# Patient Record
Sex: Female | Born: 1965
Health system: Southern US, Community
[De-identification: ages and names within clinical notes are randomized; demographics above are authoritative.]

---

## 2011-11-25 ENCOUNTER — Other Ambulatory Visit (HOSPITAL_COMMUNITY)
Admission: RE | Admit: 2011-11-25 | Discharge: 2011-11-25 | Disposition: A | Discharge status: Home / Self Care | Payer: BC Managed Care – PPO | Source: Ambulatory Visit | Attending: Family Medicine | Admitting: Family Medicine

## 2011-11-25 DIAGNOSIS — Z1151 Encounter for screening for human papillomavirus (HPV): Secondary | ICD-10-CM | POA: Insufficient documentation

## 2011-11-25 DIAGNOSIS — Z124 Encounter for screening for malignant neoplasm of cervix: Secondary | ICD-10-CM | POA: Insufficient documentation

## 2011-11-29 ENCOUNTER — Other Ambulatory Visit: Payer: Self-pay | Admitting: Family Medicine

## 2011-11-29 DIAGNOSIS — Z1231 Encounter for screening mammogram for malignant neoplasm of breast: Secondary | ICD-10-CM

## 2011-12-27 ENCOUNTER — Ambulatory Visit
Admission: RE | Admit: 2011-12-27 | Discharge: 2011-12-27 | Disposition: A | Discharge status: Home / Self Care | Payer: BC Managed Care – PPO | Source: Ambulatory Visit | Attending: Family Medicine | Admitting: Family Medicine

## 2011-12-27 DIAGNOSIS — Z1231 Encounter for screening mammogram for malignant neoplasm of breast: Secondary | ICD-10-CM

## 2013-02-15 ENCOUNTER — Other Ambulatory Visit: Payer: Self-pay

## 2013-02-15 DIAGNOSIS — Z1231 Encounter for screening mammogram for malignant neoplasm of breast: Secondary | ICD-10-CM

## 2013-03-27 ENCOUNTER — Ambulatory Visit
Admission: RE | Admit: 2013-03-27 | Discharge: 2013-03-27 | Disposition: A | Discharge status: Home / Self Care | Payer: BC Managed Care – PPO | Source: Ambulatory Visit

## 2013-03-27 DIAGNOSIS — Z1231 Encounter for screening mammogram for malignant neoplasm of breast: Secondary | ICD-10-CM

## 2013-03-28 ENCOUNTER — Other Ambulatory Visit: Payer: Self-pay | Admitting: Family Medicine

## 2013-03-28 DIAGNOSIS — R928 Other abnormal and inconclusive findings on diagnostic imaging of breast: Secondary | ICD-10-CM

## 2013-04-12 ENCOUNTER — Other Ambulatory Visit: Payer: BC Managed Care – PPO

## 2013-04-12 ENCOUNTER — Ambulatory Visit
Admission: RE | Admit: 2013-04-12 | Discharge: 2013-04-12 | Disposition: A | Discharge status: Home / Self Care | Payer: BC Managed Care – PPO | Source: Ambulatory Visit | Attending: Family Medicine | Admitting: Family Medicine

## 2013-04-12 DIAGNOSIS — R928 Other abnormal and inconclusive findings on diagnostic imaging of breast: Secondary | ICD-10-CM

## 2014-09-04 ENCOUNTER — Other Ambulatory Visit: Payer: Self-pay

## 2014-09-04 DIAGNOSIS — Z1231 Encounter for screening mammogram for malignant neoplasm of breast: Secondary | ICD-10-CM

## 2014-10-03 ENCOUNTER — Other Ambulatory Visit: Payer: Self-pay | Admitting: Family Medicine

## 2014-10-03 ENCOUNTER — Ambulatory Visit
Admission: RE | Admit: 2014-10-03 | Discharge: 2014-10-03 | Disposition: A | Discharge status: Home / Self Care | Payer: BLUE CROSS/BLUE SHIELD | Source: Ambulatory Visit

## 2014-10-03 DIAGNOSIS — R928 Other abnormal and inconclusive findings on diagnostic imaging of breast: Secondary | ICD-10-CM

## 2014-10-03 DIAGNOSIS — Z1231 Encounter for screening mammogram for malignant neoplasm of breast: Secondary | ICD-10-CM

## 2014-10-14 ENCOUNTER — Ambulatory Visit
Admission: RE | Admit: 2014-10-14 | Discharge: 2014-10-14 | Disposition: A | Discharge status: Home / Self Care | Payer: BLUE CROSS/BLUE SHIELD | Source: Ambulatory Visit | Attending: Family Medicine | Admitting: Family Medicine

## 2014-10-14 DIAGNOSIS — R928 Other abnormal and inconclusive findings on diagnostic imaging of breast: Secondary | ICD-10-CM

## 2014-10-21 ENCOUNTER — Other Ambulatory Visit (HOSPITAL_COMMUNITY)
Admission: RE | Admit: 2014-10-21 | Discharge: 2014-10-21 | Disposition: A | Discharge status: Home / Self Care | Payer: BLUE CROSS/BLUE SHIELD | Source: Ambulatory Visit | Attending: Family Medicine | Admitting: Family Medicine

## 2014-10-21 ENCOUNTER — Other Ambulatory Visit: Payer: Self-pay | Admitting: Family Medicine

## 2014-10-21 DIAGNOSIS — Z124 Encounter for screening for malignant neoplasm of cervix: Secondary | ICD-10-CM | POA: Insufficient documentation

## 2014-10-23 LAB — CYTOLOGY - PAP

## 2015-03-10 ENCOUNTER — Other Ambulatory Visit: Payer: Self-pay | Admitting: Family Medicine

## 2015-03-10 DIAGNOSIS — R928 Other abnormal and inconclusive findings on diagnostic imaging of breast: Secondary | ICD-10-CM

## 2015-05-04 ENCOUNTER — Ambulatory Visit
Admission: RE | Admit: 2015-05-04 | Discharge: 2015-05-04 | Disposition: A | Discharge status: Home / Self Care | Payer: BLUE CROSS/BLUE SHIELD | Source: Ambulatory Visit | Attending: Family Medicine | Admitting: Family Medicine

## 2015-05-04 DIAGNOSIS — R928 Other abnormal and inconclusive findings on diagnostic imaging of breast: Secondary | ICD-10-CM

## 2015-07-01 DIAGNOSIS — J029 Acute pharyngitis, unspecified: Secondary | ICD-10-CM | POA: Diagnosis not present

## 2015-07-24 DIAGNOSIS — S51852A Open bite of left forearm, initial encounter: Secondary | ICD-10-CM | POA: Diagnosis not present

## 2015-08-05 DIAGNOSIS — K529 Noninfective gastroenteritis and colitis, unspecified: Secondary | ICD-10-CM | POA: Diagnosis not present

## 2015-08-05 DIAGNOSIS — S41152D Open bite of left upper arm, subsequent encounter: Secondary | ICD-10-CM | POA: Diagnosis not present

## 2015-08-13 DIAGNOSIS — R197 Diarrhea, unspecified: Secondary | ICD-10-CM | POA: Diagnosis not present

## 2015-08-14 DIAGNOSIS — R197 Diarrhea, unspecified: Secondary | ICD-10-CM | POA: Diagnosis not present

## 2015-08-18 DIAGNOSIS — R197 Diarrhea, unspecified: Secondary | ICD-10-CM | POA: Diagnosis not present

## 2015-08-18 DIAGNOSIS — S51852A Open bite of left forearm, initial encounter: Secondary | ICD-10-CM | POA: Diagnosis not present

## 2015-09-16 ENCOUNTER — Other Ambulatory Visit: Payer: Self-pay | Admitting: Family Medicine

## 2015-09-16 DIAGNOSIS — Z1231 Encounter for screening mammogram for malignant neoplasm of breast: Secondary | ICD-10-CM

## 2015-10-14 ENCOUNTER — Ambulatory Visit: Payer: BLUE CROSS/BLUE SHIELD

## 2015-10-14 DIAGNOSIS — Z13220 Encounter for screening for lipoid disorders: Secondary | ICD-10-CM | POA: Diagnosis not present

## 2015-10-14 DIAGNOSIS — Z0000 Encounter for general adult medical examination without abnormal findings: Secondary | ICD-10-CM | POA: Diagnosis not present

## 2015-10-14 DIAGNOSIS — Z131 Encounter for screening for diabetes mellitus: Secondary | ICD-10-CM | POA: Diagnosis not present

## 2015-10-14 DIAGNOSIS — N951 Menopausal and female climacteric states: Secondary | ICD-10-CM | POA: Diagnosis not present

## 2015-10-16 ENCOUNTER — Ambulatory Visit
Admission: RE | Admit: 2015-10-16 | Discharge: 2015-10-16 | Disposition: A | Discharge status: Home / Self Care | Payer: BLUE CROSS/BLUE SHIELD | Source: Ambulatory Visit | Attending: Family Medicine | Admitting: Family Medicine

## 2015-10-16 DIAGNOSIS — Z1231 Encounter for screening mammogram for malignant neoplasm of breast: Secondary | ICD-10-CM | POA: Diagnosis not present

## 2016-01-13 DIAGNOSIS — Z23 Encounter for immunization: Secondary | ICD-10-CM | POA: Diagnosis not present

## 2016-01-13 DIAGNOSIS — N951 Menopausal and female climacteric states: Secondary | ICD-10-CM | POA: Diagnosis not present

## 2016-01-13 DIAGNOSIS — G479 Sleep disorder, unspecified: Secondary | ICD-10-CM | POA: Diagnosis not present

## 2016-05-25 DIAGNOSIS — L578 Other skin changes due to chronic exposure to nonionizing radiation: Secondary | ICD-10-CM | POA: Diagnosis not present

## 2016-05-25 DIAGNOSIS — L821 Other seborrheic keratosis: Secondary | ICD-10-CM | POA: Diagnosis not present

## 2016-05-25 DIAGNOSIS — L720 Epidermal cyst: Secondary | ICD-10-CM | POA: Diagnosis not present

## 2016-05-25 DIAGNOSIS — L814 Other melanin hyperpigmentation: Secondary | ICD-10-CM | POA: Diagnosis not present

## 2016-09-07 ENCOUNTER — Other Ambulatory Visit: Payer: Self-pay | Admitting: Family Medicine

## 2016-09-07 DIAGNOSIS — Z1231 Encounter for screening mammogram for malignant neoplasm of breast: Secondary | ICD-10-CM

## 2016-10-17 ENCOUNTER — Ambulatory Visit: Payer: BLUE CROSS/BLUE SHIELD

## 2016-10-17 ENCOUNTER — Ambulatory Visit
Admission: RE | Admit: 2016-10-17 | Discharge: 2016-10-17 | Disposition: A | Discharge status: Home / Self Care | Payer: BLUE CROSS/BLUE SHIELD | Source: Ambulatory Visit | Attending: Family Medicine | Admitting: Family Medicine

## 2016-10-17 DIAGNOSIS — Z1231 Encounter for screening mammogram for malignant neoplasm of breast: Secondary | ICD-10-CM | POA: Diagnosis not present

## 2016-10-20 DIAGNOSIS — Z13220 Encounter for screening for lipoid disorders: Secondary | ICD-10-CM | POA: Diagnosis not present

## 2016-10-20 DIAGNOSIS — Z0000 Encounter for general adult medical examination without abnormal findings: Secondary | ICD-10-CM | POA: Diagnosis not present

## 2016-10-20 DIAGNOSIS — N951 Menopausal and female climacteric states: Secondary | ICD-10-CM | POA: Diagnosis not present

## 2016-10-20 DIAGNOSIS — Z131 Encounter for screening for diabetes mellitus: Secondary | ICD-10-CM | POA: Diagnosis not present

## 2017-06-09 DIAGNOSIS — L720 Epidermal cyst: Secondary | ICD-10-CM | POA: Diagnosis not present

## 2017-06-23 DIAGNOSIS — L720 Epidermal cyst: Secondary | ICD-10-CM | POA: Diagnosis not present

## 2017-08-22 ENCOUNTER — Other Ambulatory Visit: Payer: Self-pay | Admitting: Family Medicine

## 2017-08-22 DIAGNOSIS — Z1231 Encounter for screening mammogram for malignant neoplasm of breast: Secondary | ICD-10-CM

## 2017-09-05 DIAGNOSIS — D229 Melanocytic nevi, unspecified: Secondary | ICD-10-CM | POA: Diagnosis not present

## 2017-09-05 DIAGNOSIS — L720 Epidermal cyst: Secondary | ICD-10-CM | POA: Diagnosis not present

## 2017-10-18 ENCOUNTER — Ambulatory Visit
Admission: RE | Admit: 2017-10-18 | Discharge: 2017-10-18 | Disposition: A | Discharge status: Home / Self Care | Payer: BLUE CROSS/BLUE SHIELD | Source: Ambulatory Visit | Attending: Family Medicine | Admitting: Family Medicine

## 2017-10-18 DIAGNOSIS — Z1231 Encounter for screening mammogram for malignant neoplasm of breast: Secondary | ICD-10-CM | POA: Diagnosis not present

## 2017-10-19 ENCOUNTER — Other Ambulatory Visit: Payer: Self-pay | Admitting: Family Medicine

## 2017-10-19 DIAGNOSIS — R928 Other abnormal and inconclusive findings on diagnostic imaging of breast: Secondary | ICD-10-CM

## 2017-10-24 ENCOUNTER — Ambulatory Visit
Admission: RE | Admit: 2017-10-24 | Discharge: 2017-10-24 | Disposition: A | Discharge status: Home / Self Care | Payer: BLUE CROSS/BLUE SHIELD | Source: Ambulatory Visit | Attending: Family Medicine | Admitting: Family Medicine

## 2017-10-24 DIAGNOSIS — R922 Inconclusive mammogram: Secondary | ICD-10-CM | POA: Diagnosis not present

## 2017-10-24 DIAGNOSIS — R928 Other abnormal and inconclusive findings on diagnostic imaging of breast: Secondary | ICD-10-CM

## 2017-10-24 DIAGNOSIS — N6310 Unspecified lump in the right breast, unspecified quadrant: Secondary | ICD-10-CM | POA: Diagnosis not present

## 2018-04-24 DIAGNOSIS — J40 Bronchitis, not specified as acute or chronic: Secondary | ICD-10-CM | POA: Diagnosis not present

## 2018-04-24 DIAGNOSIS — R05 Cough: Secondary | ICD-10-CM | POA: Diagnosis not present

## 2018-05-22 ENCOUNTER — Other Ambulatory Visit: Payer: Self-pay | Admitting: Family Medicine

## 2018-05-22 ENCOUNTER — Other Ambulatory Visit (HOSPITAL_COMMUNITY)
Admission: RE | Admit: 2018-05-22 | Discharge: 2018-05-22 | Disposition: A | Discharge status: Home / Self Care | Payer: BLUE CROSS/BLUE SHIELD | Source: Ambulatory Visit | Attending: Family Medicine | Admitting: Family Medicine

## 2018-05-22 DIAGNOSIS — Z124 Encounter for screening for malignant neoplasm of cervix: Secondary | ICD-10-CM | POA: Insufficient documentation

## 2018-05-22 DIAGNOSIS — N951 Menopausal and female climacteric states: Secondary | ICD-10-CM | POA: Diagnosis not present

## 2018-05-22 DIAGNOSIS — Z0000 Encounter for general adult medical examination without abnormal findings: Secondary | ICD-10-CM | POA: Diagnosis not present

## 2018-05-22 DIAGNOSIS — N950 Postmenopausal bleeding: Secondary | ICD-10-CM | POA: Diagnosis not present

## 2018-05-24 ENCOUNTER — Other Ambulatory Visit: Payer: Self-pay | Admitting: Family Medicine

## 2018-05-24 DIAGNOSIS — N950 Postmenopausal bleeding: Secondary | ICD-10-CM

## 2018-05-24 LAB — CYTOLOGY - PAP
Diagnosis: NEGATIVE
HPV: NOT DETECTED

## 2018-06-18 ENCOUNTER — Other Ambulatory Visit: Payer: BLUE CROSS/BLUE SHIELD

## 2018-09-03 ENCOUNTER — Ambulatory Visit
Admission: RE | Admit: 2018-09-03 | Discharge: 2018-09-03 | Disposition: A | Discharge status: Home / Self Care | Payer: BLUE CROSS/BLUE SHIELD | Source: Ambulatory Visit | Attending: Family Medicine | Admitting: Family Medicine

## 2018-09-03 DIAGNOSIS — N950 Postmenopausal bleeding: Secondary | ICD-10-CM

## 2018-09-12 DIAGNOSIS — N950 Postmenopausal bleeding: Secondary | ICD-10-CM | POA: Diagnosis not present

## 2018-09-13 DIAGNOSIS — R300 Dysuria: Secondary | ICD-10-CM | POA: Diagnosis not present

## 2018-09-26 ENCOUNTER — Other Ambulatory Visit: Payer: Self-pay | Admitting: Family Medicine

## 2018-09-26 ENCOUNTER — Other Ambulatory Visit: Payer: Self-pay | Admitting: Obstetrics and Gynecology

## 2018-09-26 DIAGNOSIS — N841 Polyp of cervix uteri: Secondary | ICD-10-CM | POA: Diagnosis not present

## 2018-09-26 DIAGNOSIS — Z1231 Encounter for screening mammogram for malignant neoplasm of breast: Secondary | ICD-10-CM

## 2018-09-26 DIAGNOSIS — N959 Unspecified menopausal and perimenopausal disorder: Secondary | ICD-10-CM | POA: Diagnosis not present

## 2018-09-26 DIAGNOSIS — Z803 Family history of malignant neoplasm of breast: Secondary | ICD-10-CM | POA: Diagnosis not present

## 2018-09-26 DIAGNOSIS — N858 Other specified noninflammatory disorders of uterus: Secondary | ICD-10-CM | POA: Diagnosis not present

## 2018-09-26 DIAGNOSIS — N950 Postmenopausal bleeding: Secondary | ICD-10-CM | POA: Diagnosis not present

## 2018-09-26 DIAGNOSIS — N871 Moderate cervical dysplasia: Secondary | ICD-10-CM | POA: Diagnosis not present

## 2018-11-15 ENCOUNTER — Ambulatory Visit: Payer: BC Managed Care – PPO

## 2018-12-25 ENCOUNTER — Other Ambulatory Visit: Payer: Self-pay

## 2018-12-25 ENCOUNTER — Ambulatory Visit
Admission: RE | Admit: 2018-12-25 | Discharge: 2018-12-25 | Disposition: A | Discharge status: Home / Self Care | Payer: BC Managed Care – PPO | Source: Ambulatory Visit | Attending: Family Medicine | Admitting: Family Medicine

## 2018-12-25 DIAGNOSIS — Z1231 Encounter for screening mammogram for malignant neoplasm of breast: Secondary | ICD-10-CM | POA: Diagnosis not present

## 2019-08-19 DIAGNOSIS — Z13220 Encounter for screening for lipoid disorders: Secondary | ICD-10-CM | POA: Diagnosis not present

## 2019-08-19 DIAGNOSIS — Z0000 Encounter for general adult medical examination without abnormal findings: Secondary | ICD-10-CM | POA: Diagnosis not present

## 2019-08-19 DIAGNOSIS — Z131 Encounter for screening for diabetes mellitus: Secondary | ICD-10-CM | POA: Diagnosis not present

## 2019-08-27 ENCOUNTER — Other Ambulatory Visit: Payer: Self-pay | Admitting: Family Medicine

## 2019-08-27 DIAGNOSIS — Z1231 Encounter for screening mammogram for malignant neoplasm of breast: Secondary | ICD-10-CM

## 2020-01-03 DIAGNOSIS — D229 Melanocytic nevi, unspecified: Secondary | ICD-10-CM | POA: Diagnosis not present

## 2020-01-03 DIAGNOSIS — L720 Epidermal cyst: Secondary | ICD-10-CM | POA: Diagnosis not present

## 2020-01-03 DIAGNOSIS — L814 Other melanin hyperpigmentation: Secondary | ICD-10-CM | POA: Diagnosis not present

## 2020-01-09 DIAGNOSIS — Z23 Encounter for immunization: Secondary | ICD-10-CM | POA: Diagnosis not present

## 2020-01-27 ENCOUNTER — Ambulatory Visit: Payer: Self-pay | Attending: Internal Medicine

## 2020-01-27 ENCOUNTER — Other Ambulatory Visit (HOSPITAL_BASED_OUTPATIENT_CLINIC_OR_DEPARTMENT_OTHER): Payer: Self-pay | Admitting: Internal Medicine

## 2020-01-27 DIAGNOSIS — Z23 Encounter for immunization: Secondary | ICD-10-CM

## 2020-01-28 MED FILL — PFIZER-BIONTECH COVID-19 VA: 30 | 1 days supply | Qty: 0 | Fill #0

## 2020-01-30 DIAGNOSIS — L720 Epidermal cyst: Secondary | ICD-10-CM | POA: Diagnosis not present

## 2020-02-17 DIAGNOSIS — L720 Epidermal cyst: Secondary | ICD-10-CM | POA: Diagnosis not present

## 2020-03-02 ENCOUNTER — Ambulatory Visit: Payer: BC Managed Care – PPO

## 2020-03-12 DIAGNOSIS — L089 Local infection of the skin and subcutaneous tissue, unspecified: Secondary | ICD-10-CM | POA: Diagnosis not present

## 2020-03-12 DIAGNOSIS — L810 Postinflammatory hyperpigmentation: Secondary | ICD-10-CM | POA: Diagnosis not present

## 2020-03-12 DIAGNOSIS — L723 Sebaceous cyst: Secondary | ICD-10-CM | POA: Diagnosis not present

## 2020-04-07 ENCOUNTER — Ambulatory Visit: Payer: Self-pay

## 2020-05-07 ENCOUNTER — Ambulatory Visit
Admission: RE | Admit: 2020-05-07 | Discharge: 2020-05-07 | Disposition: A | Discharge status: Home / Self Care | Payer: BC Managed Care – PPO | Source: Ambulatory Visit | Attending: Family Medicine | Admitting: Family Medicine

## 2020-05-07 ENCOUNTER — Other Ambulatory Visit: Payer: Self-pay

## 2020-05-07 DIAGNOSIS — Z1231 Encounter for screening mammogram for malignant neoplasm of breast: Secondary | ICD-10-CM | POA: Diagnosis not present

## 2020-08-21 DIAGNOSIS — Z0000 Encounter for general adult medical examination without abnormal findings: Secondary | ICD-10-CM | POA: Diagnosis not present

## 2020-08-21 DIAGNOSIS — Z23 Encounter for immunization: Secondary | ICD-10-CM | POA: Diagnosis not present

## 2020-09-07 DIAGNOSIS — Z1211 Encounter for screening for malignant neoplasm of colon: Secondary | ICD-10-CM | POA: Diagnosis not present

## 2020-09-10 DIAGNOSIS — R21 Rash and other nonspecific skin eruption: Secondary | ICD-10-CM | POA: Diagnosis not present

## 2020-09-18 DIAGNOSIS — R21 Rash and other nonspecific skin eruption: Secondary | ICD-10-CM | POA: Diagnosis not present

## 2020-10-21 DIAGNOSIS — Z23 Encounter for immunization: Secondary | ICD-10-CM | POA: Diagnosis not present

## 2020-12-31 DIAGNOSIS — Z23 Encounter for immunization: Secondary | ICD-10-CM | POA: Diagnosis not present

## 2021-06-18 ENCOUNTER — Other Ambulatory Visit: Payer: Self-pay | Admitting: Family Medicine

## 2021-06-18 DIAGNOSIS — Z1231 Encounter for screening mammogram for malignant neoplasm of breast: Secondary | ICD-10-CM

## 2021-06-21 ENCOUNTER — Ambulatory Visit
Admission: RE | Admit: 2021-06-21 | Discharge: 2021-06-21 | Disposition: A | Discharge status: Home / Self Care | Payer: BC Managed Care – PPO | Source: Ambulatory Visit | Attending: Family Medicine | Admitting: Family Medicine

## 2021-06-21 DIAGNOSIS — Z1231 Encounter for screening mammogram for malignant neoplasm of breast: Secondary | ICD-10-CM

## 2021-06-23 ENCOUNTER — Other Ambulatory Visit: Payer: Self-pay | Admitting: Family Medicine

## 2021-06-23 DIAGNOSIS — R928 Other abnormal and inconclusive findings on diagnostic imaging of breast: Secondary | ICD-10-CM

## 2021-07-06 DIAGNOSIS — R350 Frequency of micturition: Secondary | ICD-10-CM | POA: Diagnosis not present

## 2021-07-06 DIAGNOSIS — R109 Unspecified abdominal pain: Secondary | ICD-10-CM | POA: Diagnosis not present

## 2021-07-08 ENCOUNTER — Ambulatory Visit: Payer: BC Managed Care – PPO

## 2021-07-08 ENCOUNTER — Ambulatory Visit
Admission: RE | Admit: 2021-07-08 | Discharge: 2021-07-08 | Disposition: A | Discharge status: Home / Self Care | Payer: BC Managed Care – PPO | Source: Ambulatory Visit | Attending: Family Medicine | Admitting: Family Medicine

## 2021-07-08 DIAGNOSIS — R928 Other abnormal and inconclusive findings on diagnostic imaging of breast: Secondary | ICD-10-CM

## 2021-08-27 DIAGNOSIS — Z0000 Encounter for general adult medical examination without abnormal findings: Secondary | ICD-10-CM | POA: Diagnosis not present

## 2021-08-27 DIAGNOSIS — Z13220 Encounter for screening for lipoid disorders: Secondary | ICD-10-CM | POA: Diagnosis not present

## 2021-08-27 DIAGNOSIS — Z23 Encounter for immunization: Secondary | ICD-10-CM | POA: Diagnosis not present

## 2021-08-27 DIAGNOSIS — K089 Disorder of teeth and supporting structures, unspecified: Secondary | ICD-10-CM | POA: Diagnosis not present

## 2021-08-27 DIAGNOSIS — Z130 Encounter for screening for diseases of the blood and blood-forming organs and certain disorders involving the immune mechanism: Secondary | ICD-10-CM | POA: Diagnosis not present

## 2021-08-27 DIAGNOSIS — Z1321 Encounter for screening for nutritional disorder: Secondary | ICD-10-CM | POA: Diagnosis not present

## 2021-08-27 DIAGNOSIS — M5451 Vertebrogenic low back pain: Secondary | ICD-10-CM | POA: Diagnosis not present

## 2021-08-27 DIAGNOSIS — Z131 Encounter for screening for diabetes mellitus: Secondary | ICD-10-CM | POA: Diagnosis not present

## 2022-01-07 DIAGNOSIS — Z23 Encounter for immunization: Secondary | ICD-10-CM | POA: Diagnosis not present

## 2022-02-08 ENCOUNTER — Ambulatory Visit (INDEPENDENT_AMBULATORY_CARE_PROVIDER_SITE_OTHER): Payer: BC Managed Care – PPO | Admitting: Sports Medicine

## 2022-02-08 ENCOUNTER — Ambulatory Visit: Payer: Self-pay

## 2022-02-08 VITALS — BP 122/78 | Ht 66.0 in | Wt 138.0 lb

## 2022-02-08 DIAGNOSIS — M25561 Pain in right knee: Secondary | ICD-10-CM

## 2022-02-08 DIAGNOSIS — G8929 Other chronic pain: Secondary | ICD-10-CM

## 2022-02-08 DIAGNOSIS — M23203 Derangement of unspecified medial meniscus due to old tear or injury, right knee: Secondary | ICD-10-CM

## 2022-02-08 NOTE — Progress Notes (Addendum)
  SUBJECTIVE:   Chief Complaint: right knee pain  History of Presenting Illness:   Natasha Knight is a 56 year old female without a significant past medical history who presents with right knee pain.  She has been experiencing mild knee discomfort for over five years, worse with traversing stairs particularly descending. This past summer in 08-2021, she fell while on a trip to [place] and hit her right knee. Since then, the right knee has hurt significantly more than the left. The pain is located on the medial side of her right knee and occasionally she feels it radiate into her foot. It is worse with activity including prolonged periods of standing and certain movements, specifically squats, are very painful. Sometimes, the pain is accompanied by swelling. Oftentimes, the discomfort will awaken her during the night. Denies redness, clicking, locking, catching, and any numbness or tingling.  Despite the pain, she continues to remain physically active and attends an exercise class about three days per week. She works as a Museum/gallery exhibitions officer and spends most of the day on her feet. Exercise is an important source of stress relief for her and she has physical activities, including a ski trip, planned for this upcoming holiday season.    Pertinent Medical History:   none  No past medical history on file.    OBJECTIVE:   BP 122/78   Ht 5\' 6"  (1.676 m)   Wt 138 lb (62.6 kg)   LMP 08/13/2014   BMI 22.27 kg/m   Physical Examination:  Right Knee No erythema or swelling, mild tenderness to palpation over joint line, flexion 145 compared to 160 on left side, extension 0, sensation to light touch intact across dermatomes L4-L5, strength 5/5 with extension-flexion, no valgus-varus laxity, Lachman test negative, McMurray test negative, Thessaly test negative  Right Hip Strength 5/5 with abduction-flexion  Ultrasound of Right Knee Osteophyte on distal femur adjacent to articular surface,  split cartilage defect on medial side of joint, quadriceps and patellar tendons intact, medial and lateral menisci intact, medial and lateral cruciate ligaments intact    ASSESSMENT/PLAN:   Patient's presentation is most consistent with symptomatic osteophyte on the distal femur secondary to trauma from a fall sustained in 08-2021. Split cartilage defect possibly contributing to pain as well.  > Strengthening exercises targeting quadriceps while minimizing load on medial cartilage > Cryotherapy and compression sleeve to reduce inflammation  > Consider oral or topical nonsteroidal antiinflammatory drugs as needed > Follow-up with Natasha Knight in about four weeks > Exercise good judgement to avoid certain strenuous activities that elicit pain    There are no diagnoses linked to this encounter.  No follow-ups on file.  [facility], MD  02/08/2022, 9:57 AM  This patient evaluated and examined with Dr 02/10/2022  The ultrasound is as follows  Right Knee Ultrasound Suprapatellar pouch - small effusion on RT but none on left Medial meniscus with hypoechoic area of possible split and small marginal spurs at joint line Lateral meniscus is normal Quad and patellar tendons normal Trochear groove is normal  Impression; probable degenerative changes in medial meniscus RT knee exacerbated by a fall  Ultrasound and interpretation by Dr. Clearance Coots and  Clearance Coots. Fields, MD   I agree with other exam and history fidnigns.  Sibyl Parr, mD

## 2022-02-08 NOTE — Assessment & Plan Note (Signed)
Conservative care HEP Icing Compression sleeve  Reck in 2 to 3 mos

## 2022-03-04 DIAGNOSIS — J019 Acute sinusitis, unspecified: Secondary | ICD-10-CM | POA: Diagnosis not present

## 2022-03-21 DIAGNOSIS — B974 Respiratory syncytial virus as the cause of diseases classified elsewhere: Secondary | ICD-10-CM | POA: Diagnosis not present

## 2022-03-21 DIAGNOSIS — J069 Acute upper respiratory infection, unspecified: Secondary | ICD-10-CM | POA: Diagnosis not present

## 2022-06-13 DIAGNOSIS — J189 Pneumonia, unspecified organism: Secondary | ICD-10-CM | POA: Diagnosis not present

## 2022-06-16 DIAGNOSIS — R911 Solitary pulmonary nodule: Secondary | ICD-10-CM | POA: Diagnosis not present

## 2022-08-04 DIAGNOSIS — R21 Rash and other nonspecific skin eruption: Secondary | ICD-10-CM | POA: Diagnosis not present

## 2022-09-29 ENCOUNTER — Other Ambulatory Visit: Payer: Self-pay | Admitting: Family Medicine

## 2022-09-29 DIAGNOSIS — Z0000 Encounter for general adult medical examination without abnormal findings: Secondary | ICD-10-CM | POA: Diagnosis not present

## 2022-09-29 DIAGNOSIS — Z1231 Encounter for screening mammogram for malignant neoplasm of breast: Secondary | ICD-10-CM

## 2022-09-30 ENCOUNTER — Other Ambulatory Visit: Payer: Self-pay | Admitting: Family Medicine

## 2022-09-30 DIAGNOSIS — E2839 Other primary ovarian failure: Secondary | ICD-10-CM

## 2022-10-04 ENCOUNTER — Ambulatory Visit: Payer: BC Managed Care – PPO

## 2022-10-04 DIAGNOSIS — Z1231 Encounter for screening mammogram for malignant neoplasm of breast: Secondary | ICD-10-CM

## 2023-07-18 IMAGING — MG MM DIGITAL SCREENING BILAT W/ TOMO AND CAD
8 series · 9 of 24 positions shown · non-contrast
Comparison: Previous exam(s).

CLINICAL DATA: Screening.

EXAM:
DIGITAL SCREENING BILATERAL MAMMOGRAM WITH TOMOSYNTHESIS AND CAD
TECHNIQUE: Bilateral screening digital craniocaudal and mediolateral oblique
mammograms were obtained. Bilateral screening digital breast
tomosynthesis was performed. The images were evaluated with
computer-aided detection.

[R CC synth-2D]
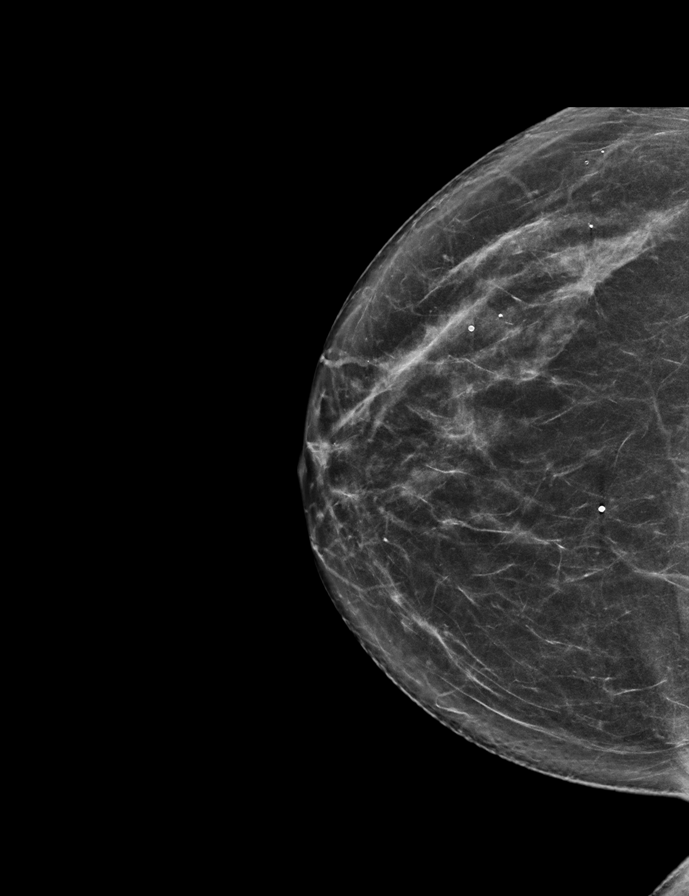

[R MLO synth-2D]
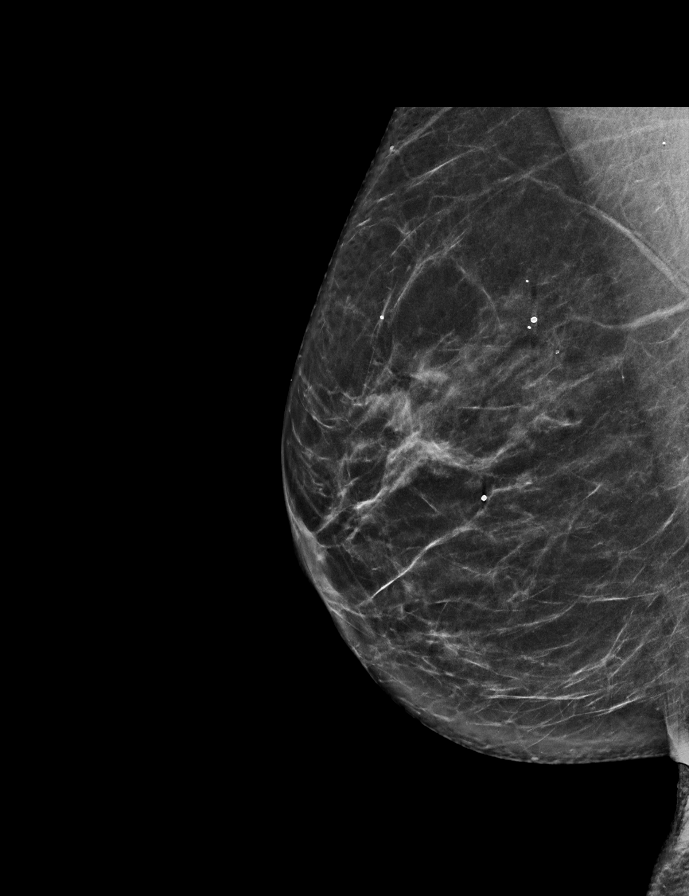

[L CC synth-2D]
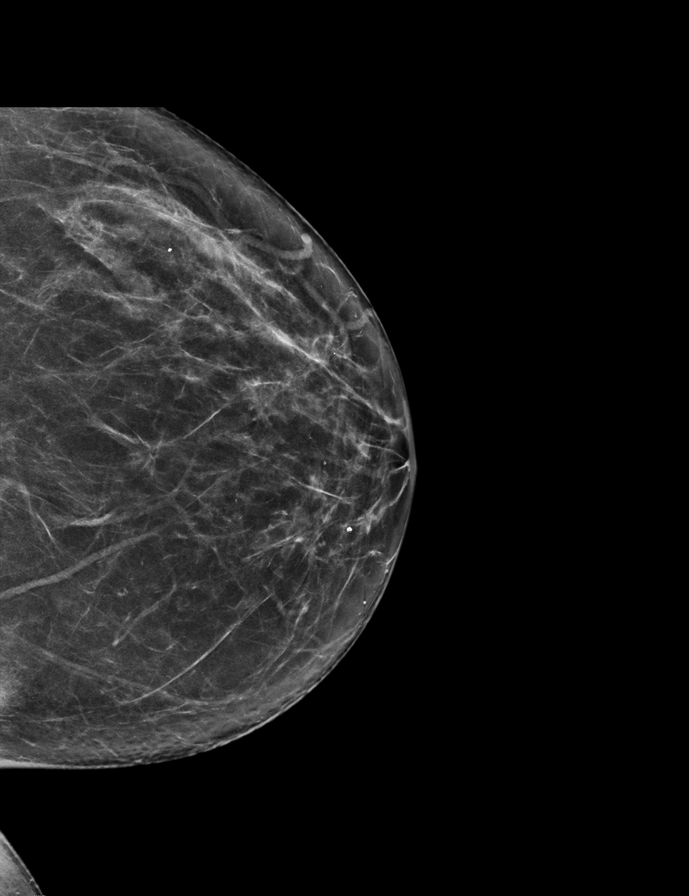

[L MLO synth-2D]
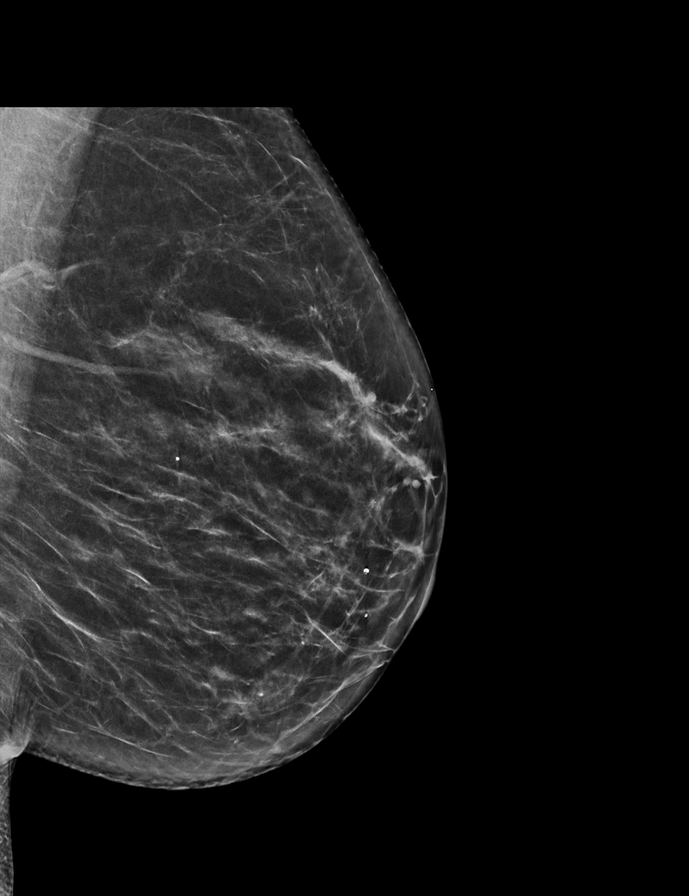

[L CC tomo · 2 of 67 frames shown]
[frame 22/67]
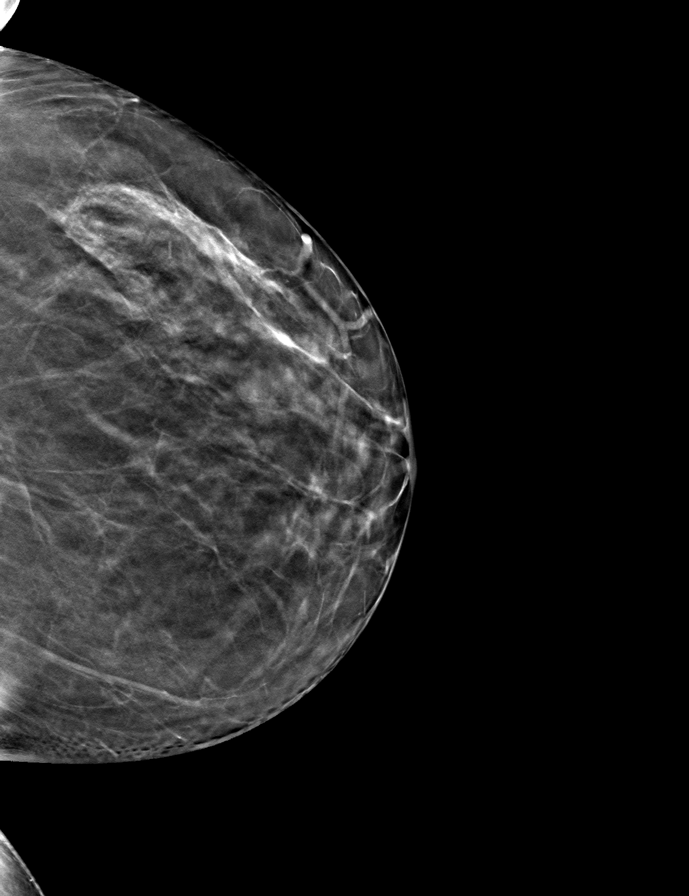
[frame 34/67]
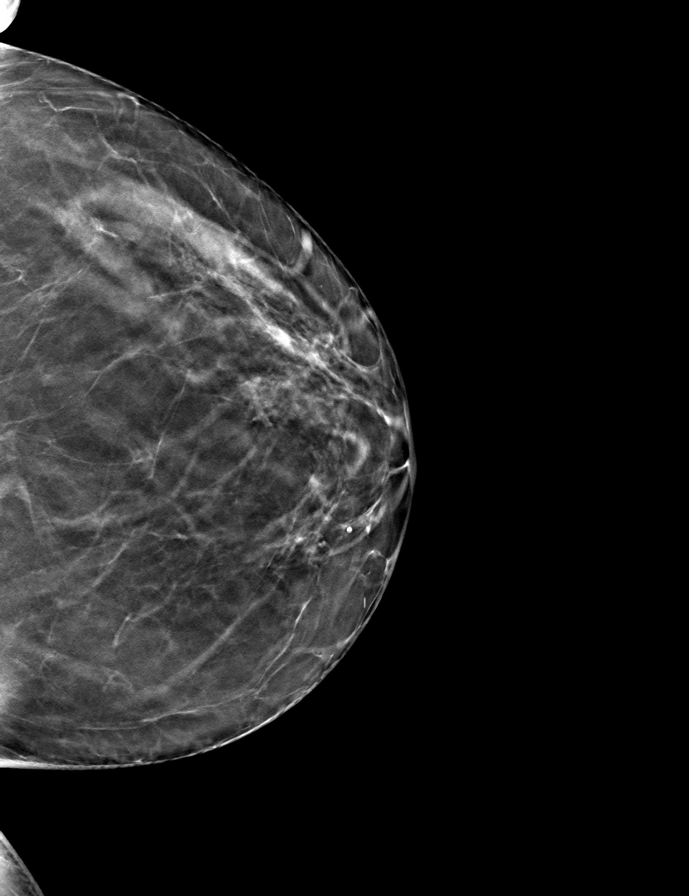

[R CC tomo · tomo slice 35/68.0]
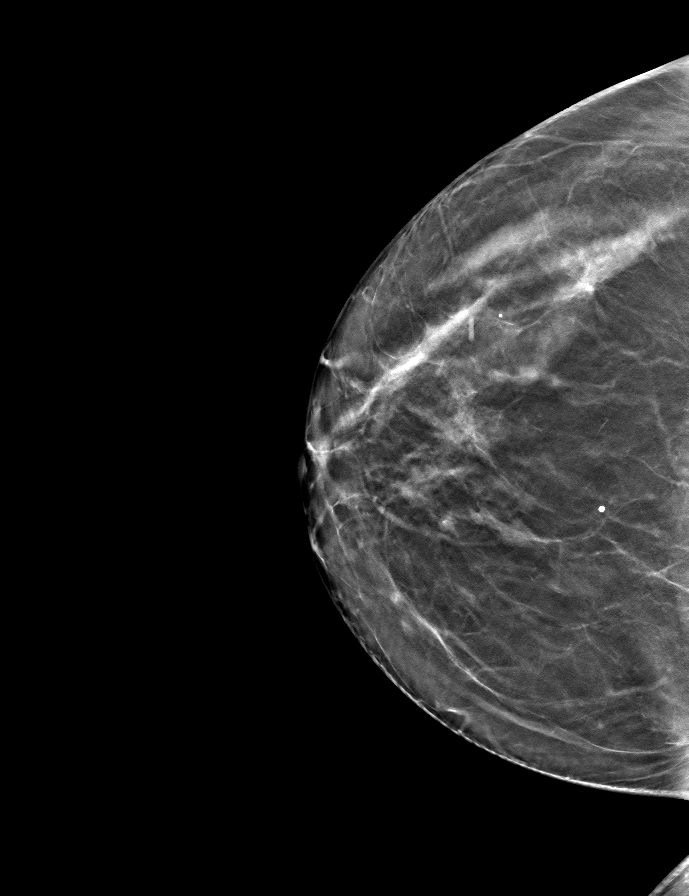

[L MLO tomo · tomo slice 34/67.0]
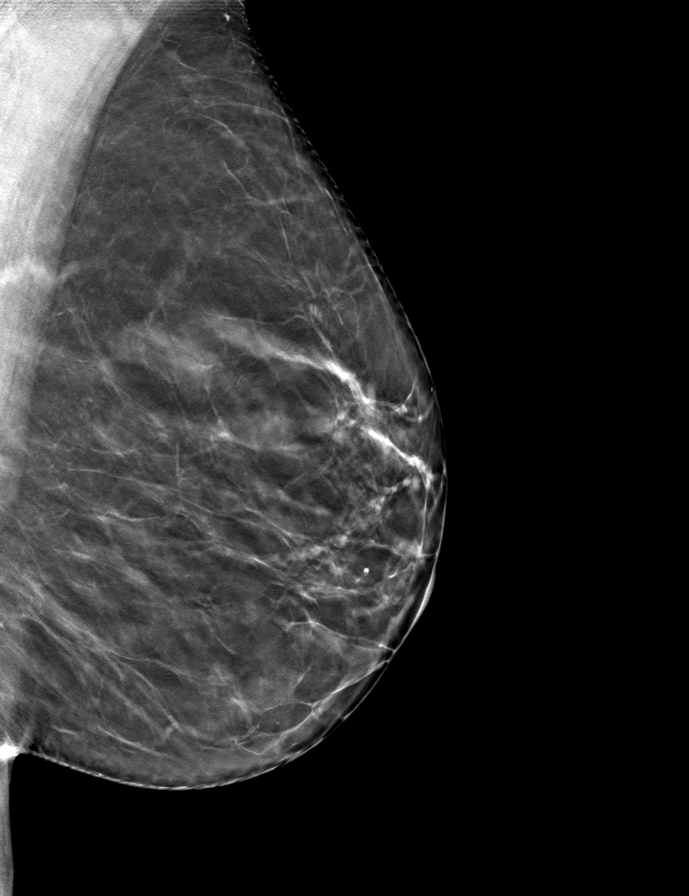

[R MLO tomo · tomo slice 35/68.0]
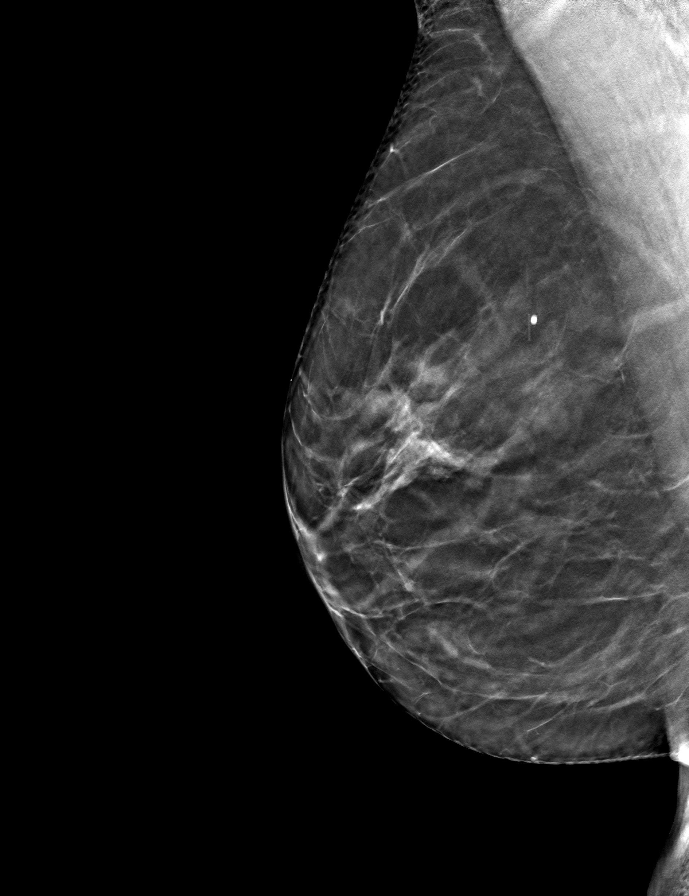

[9 of 24 positions shown; findings below may reference images not displayed]

ACR Breast Density Category b: There are scattered areas of
fibroglandular density.
FINDINGS: In the right breast, a possible asymmetry warrants further
evaluation. In the left breast, no findings suspicious for
malignancy.
IMPRESSION: Further evaluation is suggested for possible asymmetry in the right
breast.

RECOMMENDATION:
Diagnostic mammogram and possibly ultrasound of the right breast.
(Code:BU-[id number])

The patient will be contacted regarding the findings, and additional
imaging will be scheduled.

BI-RADS CATEGORY  0: Incomplete. Need additional imaging evaluation
and/or prior mammograms for comparison.

## 2023-09-04 ENCOUNTER — Other Ambulatory Visit: Payer: Self-pay | Admitting: Family Medicine

## 2023-09-04 DIAGNOSIS — Z1231 Encounter for screening mammogram for malignant neoplasm of breast: Secondary | ICD-10-CM

## 2023-09-27 ENCOUNTER — Ambulatory Visit (INDEPENDENT_AMBULATORY_CARE_PROVIDER_SITE_OTHER)

## 2023-09-27 DIAGNOSIS — E2839 Other primary ovarian failure: Secondary | ICD-10-CM

## 2023-09-27 DIAGNOSIS — Z13820 Encounter for screening for osteoporosis: Secondary | ICD-10-CM | POA: Diagnosis not present

## 2023-10-05 ENCOUNTER — Ambulatory Visit

## 2023-10-17 ENCOUNTER — Ambulatory Visit
Admission: RE | Admit: 2023-10-17 | Discharge: 2023-10-17 | Disposition: A | Discharge status: Home / Self Care | Source: Ambulatory Visit | Attending: Family Medicine | Admitting: Family Medicine

## 2023-10-17 DIAGNOSIS — Z1231 Encounter for screening mammogram for malignant neoplasm of breast: Secondary | ICD-10-CM

## 2023-10-20 ENCOUNTER — Other Ambulatory Visit: Payer: Self-pay | Admitting: Family Medicine

## 2023-10-20 DIAGNOSIS — R928 Other abnormal and inconclusive findings on diagnostic imaging of breast: Secondary | ICD-10-CM

## 2023-10-26 ENCOUNTER — Ambulatory Visit

## 2023-10-31 ENCOUNTER — Other Ambulatory Visit

## 2023-10-31 ENCOUNTER — Encounter

## 2023-11-09 ENCOUNTER — Ambulatory Visit
Admission: RE | Admit: 2023-11-09 | Discharge: 2023-11-09 | Disposition: A | Discharge status: Home / Self Care | Source: Ambulatory Visit | Attending: Family Medicine | Admitting: Family Medicine

## 2023-11-09 DIAGNOSIS — R928 Other abnormal and inconclusive findings on diagnostic imaging of breast: Secondary | ICD-10-CM
